# Patient Record
Sex: Female | Born: 1975 | Race: White | Hispanic: No | Marital: Single | Smoking: Never smoker
Health system: Southern US, Community
[De-identification: ages and names within clinical notes are randomized; demographics above are authoritative.]

## PROBLEM LIST (undated history)

## (undated) DIAGNOSIS — D649 Anemia, unspecified: Secondary | ICD-10-CM

## (undated) HISTORY — PX: CYST REMOVAL TRUNK: SHX6283

## (undated) HISTORY — PX: WISDOM TOOTH EXTRACTION: SHX21

---

## 2017-12-14 ENCOUNTER — Inpatient Hospital Stay (HOSPITAL_COMMUNITY)
Admission: AD | Admit: 2017-12-14 | Discharge: 2017-12-14 | Disposition: A | Discharge status: Home / Self Care | Payer: BLUE CROSS/BLUE SHIELD | Source: Ambulatory Visit | Attending: Obstetrics and Gynecology | Admitting: Obstetrics and Gynecology

## 2017-12-14 ENCOUNTER — Encounter (HOSPITAL_COMMUNITY): Payer: Self-pay | Admitting: *Deleted

## 2017-12-14 DIAGNOSIS — Z3A00 Weeks of gestation of pregnancy not specified: Secondary | ICD-10-CM | POA: Insufficient documentation

## 2017-12-14 DIAGNOSIS — O99019 Anemia complicating pregnancy, unspecified trimester: Secondary | ICD-10-CM

## 2017-12-14 MED ORDER — SODIUM CHLORIDE 0.9 % IV SOLN
975.0000 mg | Freq: Once | INTRAVENOUS | Status: AC
  Administered 2017-12-14: 975 mg via INTRAVENOUS
  Filled 2017-12-14: qty 19.5

## 2017-12-14 MED ORDER — SODIUM CHLORIDE 0.9 % IV SOLN
25.0000 mg | Freq: Once | INTRAVENOUS | Status: AC
  Administered 2017-12-14: 25 mg via INTRAVENOUS
  Filled 2017-12-14: qty 0.5

## 2017-12-14 NOTE — Progress Notes (Signed)
Iron test dose completed.

## 2017-12-14 NOTE — MAU Note (Signed)
[facility]. [facility].  EFM applied - FHR 150's and Toco applied - abd. Soft. Positive for fetal movement, denies sudden gush of fluid.

## 2017-12-14 NOTE — MAU Note (Signed)
[facility] to [facility] Iron Infusion - test dose started at 1405, BP 112/62, Pulse 82, sats 100%, Pt. With no complaints at this time.

## 2017-12-14 NOTE — MAU Note (Signed)
Waiting for iron to come from pharmacy.

## 2017-12-16 ENCOUNTER — Other Ambulatory Visit: Payer: Self-pay

## 2017-12-16 ENCOUNTER — Inpatient Hospital Stay (HOSPITAL_COMMUNITY)
Admission: AD | Admit: 2017-12-16 | Discharge: 2017-12-20 | DRG: 786 | Disposition: A | Discharge status: Home / Self Care | Payer: BLUE CROSS/BLUE SHIELD | Source: Ambulatory Visit | Attending: Obstetrics & Gynecology | Admitting: Obstetrics & Gynecology

## 2017-12-16 ENCOUNTER — Encounter (HOSPITAL_COMMUNITY): Payer: Self-pay

## 2017-12-16 DIAGNOSIS — D509 Iron deficiency anemia, unspecified: Secondary | ICD-10-CM | POA: Diagnosis present

## 2017-12-16 DIAGNOSIS — O4292 Full-term premature rupture of membranes, unspecified as to length of time between rupture and onset of labor: Principal | ICD-10-CM | POA: Diagnosis present

## 2017-12-16 DIAGNOSIS — O411230 Chorioamnionitis, third trimester, not applicable or unspecified: Secondary | ICD-10-CM | POA: Diagnosis present

## 2017-12-16 DIAGNOSIS — O324XX0 Maternal care for high head at term, not applicable or unspecified: Secondary | ICD-10-CM | POA: Diagnosis present

## 2017-12-16 DIAGNOSIS — O3663X0 Maternal care for excessive fetal growth, third trimester, not applicable or unspecified: Secondary | ICD-10-CM | POA: Diagnosis present

## 2017-12-16 DIAGNOSIS — O411231 Chorioamnionitis, third trimester, fetus 1: Secondary | ICD-10-CM | POA: Diagnosis present

## 2017-12-16 DIAGNOSIS — O9902 Anemia complicating childbirth: Secondary | ICD-10-CM | POA: Diagnosis present

## 2017-12-16 DIAGNOSIS — O99824 Streptococcus B carrier state complicating childbirth: Secondary | ICD-10-CM | POA: Diagnosis present

## 2017-12-16 DIAGNOSIS — O43123 Velamentous insertion of umbilical cord, third trimester: Secondary | ICD-10-CM | POA: Diagnosis present

## 2017-12-16 DIAGNOSIS — R0902 Hypoxemia: Secondary | ICD-10-CM

## 2017-12-16 DIAGNOSIS — Z3A39 39 weeks gestation of pregnancy: Secondary | ICD-10-CM

## 2017-12-16 DIAGNOSIS — O4290 Premature rupture of membranes, unspecified as to length of time between rupture and onset of labor, unspecified weeks of gestation: Secondary | ICD-10-CM | POA: Diagnosis present

## 2017-12-16 HISTORY — DX: Anemia, unspecified: D64.9

## 2017-12-16 LAB — OB RESULTS CONSOLE GBS: STREP GROUP B AG: POSITIVE

## 2017-12-16 LAB — ABO/RH: ABO/RH(D): O POS

## 2017-12-16 LAB — OB RESULTS CONSOLE ABO/RH: RH Type: POSITIVE

## 2017-12-16 LAB — OB RESULTS CONSOLE HEPATITIS B SURFACE ANTIGEN: Hepatitis B Surface Ag: NEGATIVE

## 2017-12-16 LAB — CBC
HCT: 30.3 % — ABNORMAL LOW (ref 36.0–46.0)
Hemoglobin: 9.4 g/dL — ABNORMAL LOW (ref 12.0–15.0)
MCH: 24.7 pg — ABNORMAL LOW (ref 26.0–34.0)
MCHC: 31 g/dL (ref 30.0–36.0)
MCV: 79.5 fL (ref 78.0–100.0)
Platelets: 190 10*3/uL (ref 150–400)
RBC: 3.81 MIL/uL — ABNORMAL LOW (ref 3.87–5.11)
RDW: 14.6 % (ref 11.5–15.5)
WBC: 20.3 10*3/uL — ABNORMAL HIGH (ref 4.0–10.5)

## 2017-12-16 LAB — TYPE AND SCREEN
ABO/RH(D): O POS
Antibody Screen: NEGATIVE

## 2017-12-16 LAB — OB RESULTS CONSOLE RUBELLA ANTIBODY, IGM: Rubella: IMMUNE

## 2017-12-16 LAB — OB RESULTS CONSOLE RPR: RPR: NONREACTIVE

## 2017-12-16 LAB — OB RESULTS CONSOLE HIV ANTIBODY (ROUTINE TESTING): HIV: NONREACTIVE

## 2017-12-16 MED ORDER — SODIUM CHLORIDE 0.9 % IV SOLN
5.0000 10*6.[IU] | Freq: Once | INTRAVENOUS | Status: DC
  Filled 2017-12-16: qty 5

## 2017-12-16 MED ORDER — OXYTOCIN BOLUS FROM INFUSION: 500.0000 mL | Freq: Once | INTRAVENOUS | Status: DC

## 2017-12-16 MED ORDER — NALBUPHINE HCL 10 MG/ML IJ SOLN
10.0000 mg | INTRAMUSCULAR | Status: AC
  Administered 2017-12-16: 10 mg via INTRAVENOUS
  Filled 2017-12-16: qty 1

## 2017-12-16 MED ORDER — LACTATED RINGERS IV SOLN
500.0000 mL | INTRAVENOUS | Status: DC | PRN
  Administered 2017-12-17 (×3): 500 mL via INTRAVENOUS
  Administered 2017-12-17 (×2): 300 mL via INTRAVENOUS

## 2017-12-16 MED ORDER — LACTATED RINGERS IV BOLUS (SEPSIS)
500.0000 mL | Freq: Once | INTRAVENOUS | Status: AC
  Administered 2017-12-16: 500 mL via INTRAVENOUS

## 2017-12-16 MED ORDER — LACTATED RINGERS IV SOLN
INTRAVENOUS | Status: DC
  Administered 2017-12-16 – 2017-12-17 (×3): via INTRAVENOUS

## 2017-12-16 MED ORDER — OXYCODONE-ACETAMINOPHEN 5-325 MG PO TABS: 1.0000 | ORAL_TABLET | ORAL | Status: DC | PRN

## 2017-12-16 MED ORDER — DEXTROSE 5 % IV SOLN
1.5000 mg/kg | Freq: Three times a day (TID) | INTRAVENOUS | Status: DC
  Administered 2017-12-16 – 2017-12-17 (×3): 110 mg via INTRAVENOUS
  Filled 2017-12-16 (×4): qty 2.75

## 2017-12-16 MED ORDER — ACETAMINOPHEN 325 MG PO TABS: 650.0000 mg | ORAL_TABLET | ORAL | Status: DC | PRN

## 2017-12-16 MED ORDER — OXYTOCIN 40 UNITS IN LACTATED RINGERS INFUSION - SIMPLE MED
2.5000 [IU]/h | INTRAVENOUS | Status: DC
  Filled 2017-12-16: qty 1000

## 2017-12-16 MED ORDER — OXYCODONE-ACETAMINOPHEN 5-325 MG PO TABS: 2.0000 | ORAL_TABLET | ORAL | Status: DC | PRN

## 2017-12-16 MED ORDER — ACETAMINOPHEN 500 MG PO TABS
1000.0000 mg | ORAL_TABLET | Freq: Four times a day (QID) | ORAL | Status: DC | PRN
  Administered 2017-12-16 – 2017-12-17 (×2): 1000 mg via ORAL
  Filled 2017-12-16 (×2): qty 2

## 2017-12-16 MED ORDER — PENICILLIN G POT IN DEXTROSE 60000 UNIT/ML IV SOLN
3.0000 10*6.[IU] | INTRAVENOUS | Status: DC
  Filled 2017-12-16: qty 50

## 2017-12-16 MED ORDER — LIDOCAINE HCL (PF) 1 % IJ SOLN
30.0000 mL | INTRAMUSCULAR | Status: DC | PRN
  Filled 2017-12-16: qty 30

## 2017-12-16 MED ORDER — SOD CITRATE-CITRIC ACID 500-334 MG/5ML PO SOLN
30.0000 mL | ORAL | Status: DC | PRN
  Administered 2017-12-17 (×3): 30 mL via ORAL
  Filled 2017-12-16 (×3): qty 15

## 2017-12-16 MED ORDER — LACTATED RINGERS IV SOLN
INTRAVENOUS | Status: DC
  Administered 2017-12-17 (×3): via INTRAVENOUS

## 2017-12-16 MED ORDER — SODIUM CHLORIDE 0.9 % IV SOLN
2.0000 g | Freq: Four times a day (QID) | INTRAVENOUS | Status: DC
  Administered 2017-12-16 – 2017-12-17 (×3): 2 g via INTRAVENOUS
  Filled 2017-12-16 (×2): qty 2
  Filled 2017-12-16: qty 2000
  Filled 2017-12-16: qty 2
  Filled 2017-12-16: qty 2000

## 2017-12-16 NOTE — MAU Note (Signed)
Has noticed increased discharge since yesterday that is at times watery and recently became red-tinged.  Last VE 4 cm 2 days ago.  Also reports a decrease in fetal movement and increased pressure.  Feeling cramps that come and go.

## 2017-12-16 NOTE — H&P (Signed)
  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 female presenting for questionable rupture f membranes - leaking fluid since last night at HS ? Time with intermittent leaking today. CTX started around 4pm.  Completed iron infusion this week for severe anemia.  Prenatal History: G1P0   EDC : 12/21/2017, by Other Basis  [facility]  [facility] / marginal cord insertion (anterior placenta) / LGA with 36 week sono 99% with EFW 8-13 / severe IDA with nadir of hgb 8.1 despite iron supplements  Prenatal Labs: ABO, Rh:  O positive Antibody:  negative Rubella:   Immune RPR:   NR HBsAg:   negative HIV:   NR GTT: NL GBS:   (+) bacturia  Medical / Surgical History :  Past medical history: anxiety  Past surgical history: IVF  Family History: No family history on file.   Social History:  has no tobacco, alcohol, and drug history on file. / works as psychiatric nurse practitioner  Allergies: Patient has no known allergies.    Current Medications at time of admission:  Prior to Admission medications   Medication Sig Start Date End Date Taking? Authorizing Provider  ferrous sulfate 325 (65 FE) MG tablet Take 325 mg by mouth daily with breakfast.    [provider]  hydrocortisone-pramoxine (ANALPRAM-HC) 2.5-1 % rectal cream APPLY RECTALLY 2 TIMES A DAY 11/15/17   [provider]  omeprazole (PRILOSEC) 20 MG capsule Take 20 mg by mouth every morning.    [provider]  Prenatal MV-Min-FA-Omega-3 (PRENATAL GUMMIES/DHA & FA PO) Take 4 capsules by mouth daily.    [provider]   Review of Systems: Active FM onset of ctx @ 4pm currently every 2 minutes LOF  / SROM sometime last night at HS (?)  time bloody show pink discharge  Physical Exam:  VS: Blood pressure 130/67, pulse (!) 115, temperature 98.1 F (36.7 C), resp. rate 19. Recheck temp with fetal tachycardia 101.1 F General: alert and oriented, appears uncomfortable  Heart: RRR Lungs: Clear lung fields Abdomen: Gravid, soft and non-tender, non-distended / uterus: gravid Extremities: no  edema  Genitalia / VE: Dilation: 3 Effacement (%): 90 Station: Plus 1 Exam by:: Sandra Ramos CNM   FHR: baseline rate 175-180 / variability decreased / accelerations absent / no decelerations TOCO: Q 2-3 moderate to strong  Assessment: [redacted] weeks gestation with prolonged ROM > 24 hours febrile with presumed chorioamnionitis active stage of labor mild fetal tachycardia  Plan:  Admit IVF start with LR bolus x 500ml Tylenol 1000mg  PO Urine culture and blood culture sent - start Amp and Gent Active management of labor  Dr Juliene PinaMody notified of admission / plan of care   Sandra Ramos CNM, MSN, [facility] 12/16/2017, 7:48 PM

## 2017-12-16 NOTE — Progress Notes (Signed)
Patient ID: Sandra Ramos, female   DOB: 27-Jul-1976, 42 y.o.   MRN: [id number] MD covering [facility] CNM for Dr [name] today.  CNM informed about admission of patient G1, 39.2 wks, GBS(+), SROM, prolonged, fever, tachycardia, fetal tachycardia and uterine irritability with no other focal symptoms, likely chorioamnionitis. Plan Amp/Gentamicin, Tylenol, IV hydration. FHT reviewed, tachycardia, minimal variability but no decels. Reassess once maternal fever down. Considering favorable cervix, low station, plan pitocin augmentation if needed and if FHT tolerate, anticipate SVD.  Will closely follow with CNM who has counseled patient on possible c-section if progress poor or FHT worsen.  V.Breccan Galant, MD

## 2017-12-16 NOTE — Progress Notes (Signed)
Discussion with patient of abnormal presentation with suspected chorioamnionitis from prolonged ROM / + GBS  Explained need for additional testing, start of antibiotic and Tylenol and IVF to reduce fever. Notified risk for CS delivery  If worsening condition remote from delivery.  Additionally, explained to patient and doula - not candidate for intermittent monitoring or water immersion.  Dr Juliene PinaMody notified of status - monitor closely and update with status change  Marlinda Mikeanya Jasiah Buntin CNM [facility]

## 2017-12-16 NOTE — Progress Notes (Addendum)
G1 @ 39.[redacted] wksga. Here dt r/o ROM since last night at 2200. Bloody show. Decreased FM. FM less today.   CNM Tanya at bs. Speculum exam and fern test done  1937: fern +  1950: Labs and IV started.   2002: Birthing notified. Report given. Room assigned to 162. Made aware of pt's GBS status and anemia. Instructed to hold pt. Will call back once ready to receive pt.    2047: provider notified of pt's 101.3 temperature and fetal tachycardia.  2052: Pt to birthing via wheelchair to room 162.

## 2017-12-16 NOTE — Progress Notes (Signed)
S:  requesting pain medication - considering epidural due to severe back and hip pain       received Tylenol  O:  VS: BP (!) 103/58   Pulse (!) 107   Temp 99.4 F (37.4 C) (Axillary)   Resp 18   Ht 5\' 5"  (1.651 m)   Wt 70.8 kg (156 lb)   BMI 25.96 kg/m         FHR : baseline 175 / variability decreased / accelerations + / no decelerations        Toco: contractions every 2-3 minutes / moderate to strong         Cervix : 5cm / 90% / vtx 0         membranes: clear with show - no odor        urine culture and blood culture collected  A: active labor     presumed chorioamnionitis from prolonged ROM     fetal tachycardia - reassuring tracing      P: discussed pain management options - desires epidural with something to help relax before epidural      nubain 10 mg IV then epidural placement    Sandra Ramos CNM, MSN, [facility] 12/16/2017, 10:24 PM

## 2017-12-17 ENCOUNTER — Encounter (HOSPITAL_COMMUNITY): Payer: Self-pay | Admitting: *Deleted

## 2017-12-17 ENCOUNTER — Inpatient Hospital Stay (HOSPITAL_COMMUNITY): Payer: BLUE CROSS/BLUE SHIELD

## 2017-12-17 ENCOUNTER — Encounter (HOSPITAL_COMMUNITY)
Admission: AD | Disposition: A | Discharge status: Home / Self Care | Payer: Self-pay | Source: Ambulatory Visit | Attending: Obstetrics & Gynecology

## 2017-12-17 ENCOUNTER — Inpatient Hospital Stay (HOSPITAL_COMMUNITY): Payer: BLUE CROSS/BLUE SHIELD | Admitting: Anesthesiology

## 2017-12-17 DIAGNOSIS — O411231 Chorioamnionitis, third trimester, fetus 1: Secondary | ICD-10-CM | POA: Diagnosis present

## 2017-12-17 LAB — CBC WITH DIFFERENTIAL/PLATELET
BASOS PCT: 0 %
Basophils Absolute: 0 10*3/uL (ref 0.0–0.1)
EOS ABS: 0 10*3/uL (ref 0.0–0.7)
EOS PCT: 0 %
HEMATOCRIT: 26.2 % — AB (ref 36.0–46.0)
HEMOGLOBIN: 8.2 g/dL — AB (ref 12.0–15.0)
LYMPHS PCT: 5 %
Lymphs Abs: 1.2 10*3/uL (ref 0.7–4.0)
MCH: 24.6 pg — AB (ref 26.0–34.0)
MCHC: 31.3 g/dL (ref 30.0–36.0)
MCV: 78.4 fL (ref 78.0–100.0)
MONOS PCT: 2 %
Monocytes Absolute: 0.5 10*3/uL (ref 0.1–1.0)
NEUTROS ABS: 22.2 10*3/uL — AB (ref 1.7–7.7)
Neutrophils Relative %: 93 %
OTHER: 0 %
Platelets: 148 10*3/uL — ABNORMAL LOW (ref 150–400)
RBC: 3.34 MIL/uL — ABNORMAL LOW (ref 3.87–5.11)
RDW: 14.6 % (ref 11.5–15.5)
WBC: 23.9 10*3/uL — ABNORMAL HIGH (ref 4.0–10.5)

## 2017-12-17 LAB — URINALYSIS, ROUTINE W REFLEX MICROSCOPIC
Bilirubin Urine: NEGATIVE
GLUCOSE, UA: NEGATIVE mg/dL
KETONES UR: 80 mg/dL — AB
NITRITE: NEGATIVE
PROTEIN: 100 mg/dL — AB
Specific Gravity, Urine: 1.012 (ref 1.005–1.030)
pH: 6 (ref 5.0–8.0)

## 2017-12-17 LAB — COMPREHENSIVE METABOLIC PANEL
ALT: 15 U/L (ref 14–54)
AST: 44 U/L — ABNORMAL HIGH (ref 15–41)
Albumin: 2 g/dL — ABNORMAL LOW (ref 3.5–5.0)
Alkaline Phosphatase: 204 U/L — ABNORMAL HIGH (ref 38–126)
Anion gap: 11 (ref 5–15)
BUN: 9 mg/dL (ref 6–20)
CHLORIDE: 101 mmol/L (ref 101–111)
CO2: 19 mmol/L — ABNORMAL LOW (ref 22–32)
Calcium: 7.8 mg/dL — ABNORMAL LOW (ref 8.9–10.3)
Creatinine, Ser: 0.66 mg/dL (ref 0.44–1.00)
Glucose, Bld: 97 mg/dL (ref 65–99)
POTASSIUM: 3.5 mmol/L (ref 3.5–5.1)
Sodium: 131 mmol/L — ABNORMAL LOW (ref 135–145)
TOTAL PROTEIN: 4.7 g/dL — AB (ref 6.5–8.1)
Total Bilirubin: 0.9 mg/dL (ref 0.3–1.2)

## 2017-12-17 LAB — LACTIC ACID, PLASMA: LACTIC ACID, VENOUS: 1.8 mmol/L (ref 0.5–1.9)

## 2017-12-17 LAB — RPR: RPR Ser Ql: NONREACTIVE

## 2017-12-17 LAB — PROCALCITONIN: PROCALCITONIN: 0.47 ng/mL

## 2017-12-17 SURGERY — Surgical Case
Anesthesia: Epidural

## 2017-12-17 SURGERY — Surgical Case
Anesthesia: Regional

## 2017-12-17 MED ORDER — NALBUPHINE HCL 10 MG/ML IJ SOLN
10.0000 mg | Freq: Once | INTRAMUSCULAR | Status: AC
  Administered 2017-12-17: 10 mg via INTRAVENOUS
  Filled 2017-12-17: qty 1

## 2017-12-17 MED ORDER — IBUPROFEN 600 MG PO TABS
600.0000 mg | ORAL_TABLET | Freq: Four times a day (QID) | ORAL | Status: DC
  Filled 2017-12-17 (×3): qty 1

## 2017-12-17 MED ORDER — MEPERIDINE HCL 25 MG/ML IJ SOLN
INTRAMUSCULAR | Status: AC
  Filled 2017-12-17: qty 1

## 2017-12-17 MED ORDER — DEXTROSE 5 % IV SOLN
1.5000 mg/kg | Freq: Three times a day (TID) | INTRAVENOUS | Status: DC
  Filled 2017-12-17 (×3): qty 2.75

## 2017-12-17 MED ORDER — PHENYLEPHRINE 40 MCG/ML (10ML) SYRINGE FOR IV PUSH (FOR BLOOD PRESSURE SUPPORT)
80.0000 ug | PREFILLED_SYRINGE | INTRAVENOUS | Status: DC | PRN
  Administered 2017-12-17 (×2): 80 ug via INTRAVENOUS
  Filled 2017-12-17: qty 10
  Filled 2017-12-17: qty 5

## 2017-12-17 MED ORDER — SCOPOLAMINE 1 MG/3DAYS TD PT72
MEDICATED_PATCH | TRANSDERMAL | Status: DC | PRN
  Administered 2017-12-17: 1 via TRANSDERMAL

## 2017-12-17 MED ORDER — PHENYLEPHRINE HCL 10 MG/ML IJ SOLN
INTRAMUSCULAR | Status: DC | PRN
  Administered 2017-12-17: 40 ug via INTRAVENOUS
  Administered 2017-12-17: 80 ug via INTRAVENOUS
  Administered 2017-12-17: 40 ug via INTRAVENOUS
  Administered 2017-12-17 (×2): 80 ug via INTRAVENOUS

## 2017-12-17 MED ORDER — LIDOCAINE-EPINEPHRINE (PF) 2 %-1:200000 IJ SOLN
INTRAMUSCULAR | Status: DC | PRN
  Administered 2017-12-17 (×3): 5 mL via EPIDURAL

## 2017-12-17 MED ORDER — SIMETHICONE 80 MG PO CHEW: 80.0000 mg | CHEWABLE_TABLET | ORAL | Status: DC | PRN

## 2017-12-17 MED ORDER — DEXAMETHASONE SODIUM PHOSPHATE 4 MG/ML IJ SOLN
INTRAMUSCULAR | Status: AC
  Filled 2017-12-17: qty 1

## 2017-12-17 MED ORDER — NALOXONE HCL 0.4 MG/ML IJ SOLN: 0.4000 mg | INTRAMUSCULAR | Status: DC | PRN

## 2017-12-17 MED ORDER — TERBUTALINE SULFATE 1 MG/ML IJ SOLN: 0.2500 mg | Freq: Once | INTRAMUSCULAR | Status: DC | PRN

## 2017-12-17 MED ORDER — GENTAMICIN SULFATE 40 MG/ML IJ SOLN
1.5000 mg/kg | Freq: Three times a day (TID) | INTRAVENOUS | Status: DC
  Filled 2017-12-17 (×4): qty 2.75

## 2017-12-17 MED ORDER — PHENYLEPHRINE 40 MCG/ML (10ML) SYRINGE FOR IV PUSH (FOR BLOOD PRESSURE SUPPORT)
80.0000 ug | PREFILLED_SYRINGE | INTRAVENOUS | Status: AC | PRN
  Administered 2017-12-17 (×3): 80 ug via INTRAVENOUS
  Filled 2017-12-17 (×2): qty 10

## 2017-12-17 MED ORDER — OXYCODONE HCL 5 MG PO TABS: 5.0000 mg | ORAL_TABLET | ORAL | Status: DC | PRN

## 2017-12-17 MED ORDER — KETOROLAC TROMETHAMINE 30 MG/ML IJ SOLN
INTRAMUSCULAR | Status: AC
  Filled 2017-12-17: qty 1

## 2017-12-17 MED ORDER — PIPERACILLIN-TAZOBACTAM 3.375 G IVPB
3.3750 g | Freq: Three times a day (TID) | INTRAVENOUS | Status: AC
  Administered 2017-12-18 – 2017-12-19 (×6): 3.375 g via INTRAVENOUS
  Filled 2017-12-17 (×6): qty 50

## 2017-12-17 MED ORDER — ACETAMINOPHEN 500 MG PO TABS
1000.0000 mg | ORAL_TABLET | Freq: Four times a day (QID) | ORAL | Status: DC | PRN
  Administered 2017-12-17 – 2017-12-20 (×8): 1000 mg via ORAL
  Filled 2017-12-17 (×9): qty 2

## 2017-12-17 MED ORDER — DEXAMETHASONE SODIUM PHOSPHATE 4 MG/ML IJ SOLN
INTRAMUSCULAR | Status: DC | PRN
  Administered 2017-12-17: 4 mg via INTRAVENOUS

## 2017-12-17 MED ORDER — OXYTOCIN 10 UNIT/ML IJ SOLN
INTRAMUSCULAR | Status: DC | PRN
  Administered 2017-12-17: 40 [IU] via INTRAVENOUS

## 2017-12-17 MED ORDER — SIMETHICONE 80 MG PO CHEW
80.0000 mg | CHEWABLE_TABLET | Freq: Three times a day (TID) | ORAL | Status: DC
  Administered 2017-12-18 – 2017-12-19 (×6): 80 mg via ORAL
  Filled 2017-12-17 (×7): qty 1

## 2017-12-17 MED ORDER — PRENATAL MULTIVITAMIN CH
1.0000 | ORAL_TABLET | Freq: Every day | ORAL | Status: DC
  Administered 2017-12-18 – 2017-12-20 (×3): 1 via ORAL
  Filled 2017-12-17 (×3): qty 1

## 2017-12-17 MED ORDER — MORPHINE SULFATE (PF) 0.5 MG/ML IJ SOLN
INTRAMUSCULAR | Status: AC
  Filled 2017-12-17: qty 10

## 2017-12-17 MED ORDER — FERROUS SULFATE 325 (65 FE) MG PO TABS
325.0000 mg | ORAL_TABLET | Freq: Every day | ORAL | Status: DC
  Filled 2017-12-17 (×2): qty 1

## 2017-12-17 MED ORDER — SODIUM CHLORIDE 0.9% FLUSH: 3.0000 mL | INTRAVENOUS | Status: DC | PRN

## 2017-12-17 MED ORDER — SCOPOLAMINE 1 MG/3DAYS TD PT72
MEDICATED_PATCH | TRANSDERMAL | Status: AC
  Filled 2017-12-17: qty 1

## 2017-12-17 MED ORDER — PHENYLEPHRINE 40 MCG/ML (10ML) SYRINGE FOR IV PUSH (FOR BLOOD PRESSURE SUPPORT)
PREFILLED_SYRINGE | INTRAVENOUS | Status: AC
  Filled 2017-12-17: qty 10

## 2017-12-17 MED ORDER — METHYLERGONOVINE MALEATE 0.2 MG/ML IJ SOLN
INTRAMUSCULAR | Status: DC | PRN
  Administered 2017-12-17: 0.2 mg via INTRAMUSCULAR

## 2017-12-17 MED ORDER — ONDANSETRON HCL 4 MG/2ML IJ SOLN
4.0000 mg | Freq: Three times a day (TID) | INTRAMUSCULAR | Status: DC | PRN
  Administered 2017-12-17: 4 mg via INTRAVENOUS
  Filled 2017-12-17: qty 2

## 2017-12-17 MED ORDER — FENTANYL 2.5 MCG/ML BUPIVACAINE 1/10 % EPIDURAL INFUSION (WH - ANES)
14.0000 mL/h | INTRAMUSCULAR | Status: DC | PRN
  Administered 2017-12-17 (×2): 14 mL/h via EPIDURAL
  Filled 2017-12-17 (×2): qty 100

## 2017-12-17 MED ORDER — MORPHINE SULFATE (PF) 0.5 MG/ML IJ SOLN
INTRAMUSCULAR | Status: DC | PRN
  Administered 2017-12-17: 1 mg via INTRAVENOUS
  Administered 2017-12-17: 4 mg via EPIDURAL

## 2017-12-17 MED ORDER — OXYTOCIN 10 UNIT/ML IJ SOLN
INTRAMUSCULAR | Status: AC
  Filled 2017-12-17: qty 4

## 2017-12-17 MED ORDER — COCONUT OIL OIL
1.0000 "application " | TOPICAL_OIL | Status: DC | PRN
  Administered 2017-12-17: 1 via TOPICAL
  Filled 2017-12-17: qty 120

## 2017-12-17 MED ORDER — OXYCODONE HCL 5 MG PO TABS: 10.0000 mg | ORAL_TABLET | ORAL | Status: DC | PRN

## 2017-12-17 MED ORDER — DEXTROSE 5 % IV SOLN: 110.0000 mg | Freq: Three times a day (TID) | INTRAVENOUS | Status: DC

## 2017-12-17 MED ORDER — DIPHENHYDRAMINE HCL 25 MG PO CAPS: 25.0000 mg | ORAL_CAPSULE | Freq: Four times a day (QID) | ORAL | Status: DC | PRN

## 2017-12-17 MED ORDER — FERROUS SULFATE 325 (65 FE) MG PO TABS
325.0000 mg | ORAL_TABLET | Freq: Two times a day (BID) | ORAL | Status: DC
  Administered 2017-12-18 – 2017-12-20 (×5): 325 mg via ORAL
  Filled 2017-12-17 (×5): qty 1

## 2017-12-17 MED ORDER — LACTATED RINGERS IV SOLN
INTRAVENOUS | Status: DC
  Administered 2017-12-17 – 2017-12-18 (×2): via INTRAVENOUS

## 2017-12-17 MED ORDER — LACTATED RINGERS IV SOLN
500.0000 mL | Freq: Once | INTRAVENOUS | Status: AC
  Administered 2017-12-17: 500 mL via INTRAVENOUS

## 2017-12-17 MED ORDER — HYDROCORTISONE ACE-PRAMOXINE 2.5-1 % RE CREA
TOPICAL_CREAM | Freq: Three times a day (TID) | RECTAL | Status: DC
  Filled 2017-12-17: qty 30

## 2017-12-17 MED ORDER — SODIUM CHLORIDE 0.9 % IV SOLN
2.0000 g | Freq: Four times a day (QID) | INTRAVENOUS | Status: DC
  Administered 2017-12-17: 2 g via INTRAVENOUS
  Filled 2017-12-17 (×4): qty 2000
  Filled 2017-12-17: qty 2
  Filled 2017-12-17: qty 2000

## 2017-12-17 MED ORDER — DIBUCAINE 1 % RE OINT: 1.0000 "application " | TOPICAL_OINTMENT | RECTAL | Status: DC | PRN

## 2017-12-17 MED ORDER — OXYTOCIN 40 UNITS IN LACTATED RINGERS INFUSION - SIMPLE MED
1.0000 m[IU]/min | INTRAVENOUS | Status: DC
  Administered 2017-12-17: 2 m[IU]/min via INTRAVENOUS

## 2017-12-17 MED ORDER — ACETAMINOPHEN 325 MG PO TABS
650.0000 mg | ORAL_TABLET | ORAL | Status: DC | PRN
  Administered 2017-12-20: 650 mg via ORAL
  Filled 2017-12-17: qty 2

## 2017-12-17 MED ORDER — SODIUM CHLORIDE 0.9 % IR SOLN
Status: DC | PRN
  Administered 2017-12-17: 1

## 2017-12-17 MED ORDER — HYDROCORTISONE 2.5 % RE CREA
TOPICAL_CREAM | Freq: Three times a day (TID) | RECTAL | Status: DC
  Filled 2017-12-17: qty 28.35

## 2017-12-17 MED ORDER — SODIUM CHLORIDE 0.9 % IV SOLN
INTRAVENOUS | Status: AC
  Administered 2017-12-17: 500 mL/h via INTRAVENOUS

## 2017-12-17 MED ORDER — SENNOSIDES-DOCUSATE SODIUM 8.6-50 MG PO TABS
2.0000 | ORAL_TABLET | ORAL | Status: DC
  Administered 2017-12-20: 2 via ORAL
  Filled 2017-12-17: qty 2

## 2017-12-17 MED ORDER — EPHEDRINE 5 MG/ML INJ: 10.0000 mg | INTRAVENOUS | Status: DC | PRN

## 2017-12-17 MED ORDER — DIPHENHYDRAMINE HCL 50 MG/ML IJ SOLN: 12.5000 mg | INTRAMUSCULAR | Status: DC | PRN

## 2017-12-17 MED ORDER — TETANUS-DIPHTH-ACELL PERTUSSIS 5-2.5-18.5 LF-MCG/0.5 IM SUSP: 0.5000 mL | Freq: Once | INTRAMUSCULAR | Status: DC

## 2017-12-17 MED ORDER — FUROSEMIDE 10 MG/ML IJ SOLN
20.0000 mg | Freq: Once | INTRAMUSCULAR | Status: DC
  Filled 2017-12-17: qty 2

## 2017-12-17 MED ORDER — PIPERACILLIN-TAZOBACTAM 3.375 G IVPB 30 MIN
3.3750 g | Freq: Once | INTRAVENOUS | Status: AC
  Administered 2017-12-17: 3.375 g via INTRAVENOUS
  Filled 2017-12-17: qty 50

## 2017-12-17 MED ORDER — ONDANSETRON HCL 4 MG/2ML IJ SOLN
INTRAMUSCULAR | Status: DC | PRN
  Administered 2017-12-17: 4 mg via INTRAVENOUS

## 2017-12-17 MED ORDER — LACTATED RINGERS IV SOLN
INTRAVENOUS | Status: DC | PRN
  Administered 2017-12-17: 13:00:00 via INTRAVENOUS

## 2017-12-17 MED ORDER — LIDOCAINE HCL (PF) 1 % IJ SOLN
INTRAMUSCULAR | Status: DC | PRN
  Administered 2017-12-17 (×2): 5 mL via EPIDURAL

## 2017-12-17 MED ORDER — PANTOPRAZOLE SODIUM 40 MG PO TBEC
40.0000 mg | DELAYED_RELEASE_TABLET | Freq: Every day | ORAL | Status: DC
Start: 2017-12-17 — End: 2017-12-17

## 2017-12-17 MED ORDER — OXYTOCIN 40 UNITS IN LACTATED RINGERS INFUSION - SIMPLE MED: 2.5000 [IU]/h | INTRAVENOUS | Status: AC

## 2017-12-17 MED ORDER — KETOROLAC TROMETHAMINE 30 MG/ML IJ SOLN
30.0000 mg | Freq: Four times a day (QID) | INTRAMUSCULAR | Status: AC | PRN
  Administered 2017-12-17: 30 mg via INTRAMUSCULAR

## 2017-12-17 MED ORDER — SIMETHICONE 80 MG PO CHEW
80.0000 mg | CHEWABLE_TABLET | ORAL | Status: DC
  Administered 2017-12-19: 80 mg via ORAL
  Filled 2017-12-17 (×2): qty 1

## 2017-12-17 MED ORDER — PHENYLEPHRINE 40 MCG/ML (10ML) SYRINGE FOR IV PUSH (FOR BLOOD PRESSURE SUPPORT)
80.0000 ug | PREFILLED_SYRINGE | INTRAVENOUS | Status: AC | PRN
  Administered 2017-12-17 (×3): 80 ug via INTRAVENOUS

## 2017-12-17 MED ORDER — MENTHOL 3 MG MT LOZG
1.0000 | LOZENGE | OROMUCOSAL | Status: DC | PRN
  Administered 2017-12-19: 3 mg via ORAL
  Filled 2017-12-17 (×2): qty 9

## 2017-12-17 MED ORDER — MEPERIDINE HCL 25 MG/ML IJ SOLN
INTRAMUSCULAR | Status: DC | PRN
  Administered 2017-12-17 (×2): 12.5 mg via INTRAVENOUS

## 2017-12-17 MED ORDER — METHYLERGONOVINE MALEATE 0.2 MG/ML IJ SOLN
INTRAMUSCULAR | Status: AC
  Filled 2017-12-17: qty 1

## 2017-12-17 MED ORDER — ONDANSETRON HCL 4 MG/2ML IJ SOLN
INTRAMUSCULAR | Status: AC
  Filled 2017-12-17: qty 2

## 2017-12-17 MED ORDER — KETOROLAC TROMETHAMINE 30 MG/ML IJ SOLN: 30.0000 mg | Freq: Four times a day (QID) | INTRAMUSCULAR | Status: AC | PRN

## 2017-12-17 MED ORDER — WITCH HAZEL-GLYCERIN EX PADS: 1.0000 "application " | MEDICATED_PAD | CUTANEOUS | Status: DC | PRN

## 2017-12-17 MED ORDER — SODIUM CHLORIDE 0.9 % IV SOLN: 2.0000 g | Freq: Four times a day (QID) | INTRAVENOUS | Status: DC

## 2017-12-17 SURGICAL SUPPLY — 35 items
BENZOIN TINCTURE PRP APPL 2/3 (GAUZE/BANDAGES/DRESSINGS) ×3 IMPLANT
CHLORAPREP W/TINT 26ML (MISCELLANEOUS) ×3 IMPLANT
CLAMP CORD UMBIL (MISCELLANEOUS) IMPLANT
CLOSURE STERI STRIP 1/2 X4 (GAUZE/BANDAGES/DRESSINGS) ×3 IMPLANT
CLOSURE WOUND 1/2 X4 (GAUZE/BANDAGES/DRESSINGS)
CLOTH BEACON ORANGE TIMEOUT ST (SAFETY) ×3 IMPLANT
DRSG OPSITE POSTOP 4X10 (GAUZE/BANDAGES/DRESSINGS) ×3 IMPLANT
ELECT REM PT RETURN 9FT ADLT (ELECTROSURGICAL) ×3
ELECTRODE REM PT RTRN 9FT ADLT (ELECTROSURGICAL) ×1 IMPLANT
EXTRACTOR VACUUM KIWI (MISCELLANEOUS) IMPLANT
EXTRACTOR VACUUM M CUP 4 TUBE (SUCTIONS) IMPLANT
EXTRACTOR VACUUM M CUP 4' TUBE (SUCTIONS)
GLOVE BIO SURGEON STRL SZ7 (GLOVE) ×3 IMPLANT
GLOVE BIOGEL PI IND STRL 7.0 (GLOVE) ×2 IMPLANT
GLOVE BIOGEL PI INDICATOR 7.0 (GLOVE) ×4
GOWN STRL REUS W/TWL LRG LVL3 (GOWN DISPOSABLE) ×6 IMPLANT
KIT ABG SYR 3ML LUER SLIP (SYRINGE) IMPLANT
NEEDLE HYPO 25X5/8 SAFETYGLIDE (NEEDLE) ×3 IMPLANT
NS IRRIG 1000ML POUR BTL (IV SOLUTION) ×3 IMPLANT
PACK C SECTION WH (CUSTOM PROCEDURE TRAY) ×3 IMPLANT
PAD OB MATERNITY 4.3X12.25 (PERSONAL CARE ITEMS) ×3 IMPLANT
RTRCTR C-SECT PINK 25CM LRG (MISCELLANEOUS) IMPLANT
STRIP CLOSURE SKIN 1/2X4 (GAUZE/BANDAGES/DRESSINGS) IMPLANT
SUT MON AB-0 CT1 36 (SUTURE) ×6 IMPLANT
SUT PLAIN 0 NONE (SUTURE) IMPLANT
SUT PLAIN 2 0 (SUTURE)
SUT PLAIN ABS 2-0 CT1 27XMFL (SUTURE) IMPLANT
SUT VIC AB 0 CT1 27 (SUTURE) ×4
SUT VIC AB 0 CT1 27XBRD ANBCTR (SUTURE) ×2 IMPLANT
SUT VIC AB 2-0 CT1 27 (SUTURE) ×2
SUT VIC AB 2-0 CT1 TAPERPNT 27 (SUTURE) ×1 IMPLANT
SUT VIC AB 4-0 KS 27 (SUTURE) ×3 IMPLANT
SUT VICRYL 0 TIES 12 18 (SUTURE) IMPLANT
TOWEL OR 17X24 6PK STRL BLUE (TOWEL DISPOSABLE) ×3 IMPLANT
TRAY FOLEY BAG SILVER LF 14FR (SET/KITS/TRAYS/PACK) IMPLANT

## 2017-12-17 NOTE — Progress Notes (Signed)
Pharmacy Antibiotic Note  Sandra Ramos is a 42 y.o. female admitted on 12/16/2017 with ROM > 24 hours with presumed chorioamnionitis.  Pharmacy has been consulted for Zosyn dosing for r/o sepsis due to low O2 sats, diaphoretic s/p LTCS.  Plan: Zosyn 3.375 gram IV q8h No further dosage adjustments necessary  Height: 5\' 5"  (165.1 cm) Weight: 156 lb (70.8 kg) IBW/kg (Calculated) : 57  Temp (24hrs), Avg:99.3 F (37.4 C), Min:98.1 F (36.7 C), Max:101.3 F (38.5 C)  Recent Labs  Lab 12/16/17 1950 12/17/17 1656  WBC 20.3* 23.9*    CrCl cannot be calculated (No order found.).    No Known Allergies  Antimicrobials this admission: Amp 2 gram IV q6h  3/14 >> 3/15 Gent 110mg  IV q8h  3/14 >> 3/15    Microbiology results: 3/14:  BCx:  3.14:  UCx:    Thank you for allowing pharmacy to be a part of this patient's care.  Claybon Jabsngel, Juni Glaab G 12/17/2017 5:26 PM

## 2017-12-17 NOTE — Progress Notes (Signed)
Dr Malen GauzeFoster, anesthesia, in patient's room to evaluate patient. Bedside report was given including labs, VS, Oxygen 4L and O2 sats & urine output are reviewed.

## 2017-12-17 NOTE — Anesthesia Preprocedure Evaluation (Signed)
Anesthesia Evaluation  Patient identified by MRN, date of birth, ID band Patient awake    Reviewed: Allergy & Precautions, NPO status , Patient's Chart, lab work & pertinent test results  History of Anesthesia Complications Negative for: history of anesthetic complications  Airway Mallampati: II  TM Distance: >3 FB Neck ROM: Full    Dental no notable dental hx. (+) Dental Advisory Given   Pulmonary neg pulmonary ROS,    Pulmonary exam normal        Cardiovascular negative cardio ROS Normal cardiovascular exam     Neuro/Psych negative neurological ROS  negative psych ROS   GI/Hepatic negative GI ROS, Neg liver ROS,   Endo/Other  negative endocrine ROS  Renal/GU negative Renal ROS  negative genitourinary   Musculoskeletal negative musculoskeletal ROS (+)   Abdominal   Peds negative pediatric ROS (+)  Hematology  (+) anemia ,   Anesthesia Other Findings   Reproductive/Obstetrics (+) Pregnancy                             Anesthesia Physical Anesthesia Plan  ASA: II  Anesthesia Plan: Epidural   Post-op Pain Management:    Induction:   PONV Risk Score and Plan:   Airway Management Planned: Natural Airway  Additional Equipment:   Intra-op Plan:   Post-operative Plan:   Informed Consent: I have reviewed the patients History and Physical, chart, labs and discussed the procedure including the risks, benefits and alternatives for the proposed anesthesia with the patient or authorized representative who has indicated his/her understanding and acceptance.   Dental advisory given  Plan Discussed with: Anesthesiologist  Anesthesia Plan Comments:         Anesthesia Quick Evaluation

## 2017-12-17 NOTE — Progress Notes (Signed)
Notified Dr Chaney MallingHodierne, anesthesia, notified of pt's unstable oxygen saturation in 70's and 80's, clear lungs, incentive spir. 2200, Pt is oriented. Slight tachicardia. Hx chorio, c/s delivery this afternoon. Order for oxygen therapy is received. Dr Chaney MallingHodierne will come to bedside to evaluate the patient.

## 2017-12-17 NOTE — Progress Notes (Signed)
Called to evaluate ~ 0700 - prolonged decel after epidural re-dose FHR 155 moderate variability + accels with 2 prolonged decels Cervix 10 cm / 100% vtx ROT +1 station  Consulted / updated Dr Juliene PinaMody with status  Will trial active second stage instad of passive descent due to FHR decels Update with status 1-2 hours  Sandra Ramos CNM [facility]

## 2017-12-17 NOTE — Progress Notes (Signed)
Dr Juliene PinaMody has been called about pt status and anesthesia evaluation. Dr Juliene PinaMody is present to evaluate Ms Sandra Ramos. Status and orders are reviewed. Dr Juliene PinaMody is aware that No Bolus order was received from anesthesia.

## 2017-12-17 NOTE — Op Note (Signed)
12/17/2017 Procedure Note Sandra Ramos  Procedure: Primary Low Transverse Cesarean Section   Indications: Arrest of descent, second stage 6 hours, pushed actively 3-4 hrs with station not progressing past +1 and OP   Pre-operative Diagnosis: Failure to descent in second stage                                               Chorioamnionitis   Post-operative Diagnosis: Same   Surgeon: Shea Evans, MD   Assistants: Marlinda Mike, CNM   Anesthesia: epidural   Procedure Details:  The patient was seen in the Holding Room. The risks, benefits, complications, treatment options, and expected outcomes were discussed with the patient. The patient concurred with the proposed plan, giving informed consent. identified as Sandra Ramos and the procedure verified as C-Section Delivery. A Time Out was held and the above information confirmed. Patient was receiving Ampicillin and Gentamicin for chorioamnionitis and will continue postpartum for 48 hours.  After induction of epidural anesthesia, the patient was draped and prepped in the usual sterile manner. Foley was reinserted and draining well.  A Pfannenstiel Incision was made and carried down through the subcutaneous tissue to the fascia. Fascial incision was made and extended transversely. The fascia was separated from the underlying rectus tissue superiorly and inferiorly. The peritoneum was identified and entered. Peritoneal incision was extended longitudinally. Lower segment was edematous and bulging. Alexis retractor placed. The utero-vesical peritoneal reflection was incised transversely and the bladder flap was bluntly freed from the lower uterine segment. A low transverse uterine incision was made. Clear amniotic fluid drained. Baby was deeply wedged in the mid pelvis and AP diameter of pelvis was very narrow so I could barely get my hand in and under the head but rotation was difficult from OP position. So while kept my fingers under the head, RN Mardella Layman  assisted per vagina with vaginal head flexed forward from OP position and now I could complete rotation and deliver the head. A long caput was noted. Baby was large and delivered with fundal pressure once head delivery was completed. Birth at 13.23 hours. Delayed cord clamping done at 1 minute and Apgar scores of 7 at one minute and 9 at five minutes. Cord ph was sent. Cord blood was obtained for evaluation. Uterine cavity was warm and lower segment boggy. Since she had chorioamnionitis and a large baby, she is risk of postpartum hemorrhage, so prophylactic methergine 0.2 mg IM was given.  The placenta was removed in pieces as it was breaking apart and all sent to pathology, Uterine cavity explored and was empty. The uterine outline, tubes and ovaries appeared normal. The uterine incision was closed with running locked sutures of followed by another imbricating layer.  Hemostasis was observed.Sandra Ramos retractor removed. Peritoneal closure done with 2-0 Vicryl. The fascia was then reapproximated with running sutures of 0Vicryl. The skin was closed with 4-0Vicryl. Steristrips and dressing placed.   Instrument, sponge, and needle counts were correct prior the abdominal closure and were correct at the conclusion of the case.   Findings: Deeply wedged head due to pushing, delivered with vaginal hand assist to lift the head.    Estimated Blood Loss: 868 mL   Total IV Fluids: 2500 cc LR  Urine Output: 145CC OF clear urine  Specimens: Cord blood, placenta, cord pH  Complications: no complications  Disposition: PACU -  hemodynamically stable.   Maternal Condition: stable   Baby condition / location:  Couplet care / Skin to Skin  Attending Attestation: I performed the procedure.   Signed: Surgeon(s): Shea EvansMody, Rosena Bartle, MD

## 2017-12-17 NOTE — Progress Notes (Signed)
S: comfortable with epidural - some pressure persists in hips and back  O:  VS: BP 100/69   Pulse (!) 117   Temp 98.6 F (37 C) (Axillary)   Resp 16   Ht 5\' 5"  (1.651 m)   Wt 70.8 kg (156 lb)   BMI 25.96 kg/m         FHR : baseline 155 / variability moderate / accelerations 10x10 / prolonged deceleration after epidural secondary to maternal hypotension - resolved        Toco: contractions every 2 minutes / 90 / strong        Cervix : 8 / 90% vtx + 1        Foley draining dark amber urine - bolus 1 liter pre-epidural  A: active labor     FHR category 2     Presumed chorioamnionitis - abx therapy initiated  P: IVF bolus x additional 300ml now      repositioned on peanut         recheck 2 hours or sooner with rectal presure   Marlinda Mikeanya Aunisty Reali CNM, MSN, [facility] 12/17/2017, 3:58 AM

## 2017-12-17 NOTE — Transfer of Care (Signed)
Immediate Anesthesia Transfer of Care Note  Patient: Sandra Ramos  Procedure(s) Performed: CESAREAN SECTION (N/A )  Patient Location: PACU  Anesthesia Type:Epidural  Level of Consciousness: awake, alert  and oriented  Airway & Oxygen Therapy: Patient Spontanous Breathing and Patient connected to nasal cannula oxygen  Post-op Assessment: Report given to RN and Post -op Vital signs reviewed and stable  Post vital signs: Reviewed and stable  Last Vitals:  Vitals:   12/17/17 1248 12/17/17 1252  BP: (!) 106/58 (!) 105/51  Pulse: (!) 118 (!) 120  Resp:    Temp:      Last Pain:  Vitals:   12/17/17 1239  TempSrc: Axillary         Complications: No apparent anesthesia complications

## 2017-12-17 NOTE — Progress Notes (Signed)
Sandra Ramos is a 42 y.o. G1P0 at 39.3 wks.  Admitted with SROM/ chorio, progressed well but complete and nut much progress in station past +1. LGA, not a vacuum candidate at this station.  Risks/complications of surgery reviewed incl infection, bleeding, damage to internal organs including bladder, bowels, ureters, blood vessels, other risks from anesthesia, VTE and delayed complications of any surgery, complications in future surgery reviewed. Also discussed neonatal complications incl difficult delivery, laceration, vacuum assistance, TTN etc. Pt understands and agrees, all concerns addressed.     Robley FriesVaishali R Anasophia Pecor 12/17/2017, 12:50 PM

## 2017-12-17 NOTE — Anesthesia Postprocedure Evaluation (Signed)
Anesthesia Post Note  Patient: Sharman CrateLaurie Pellum  Procedure(s) Performed: CESAREAN SECTION (N/A )     Patient location during evaluation: PACU Anesthesia Type: Epidural Level of consciousness: oriented and awake and alert Pain management: pain level controlled Vital Signs Assessment: post-procedure vital signs reviewed and stable Respiratory status: spontaneous breathing, respiratory function stable and patient connected to nasal cannula oxygen Cardiovascular status: blood pressure returned to baseline and stable Postop Assessment: no headache, no backache and no apparent nausea or vomiting Anesthetic complications: no    Last Vitals:  Vitals:   12/17/17 1700 12/17/17 1720  BP:    Pulse:    Resp:    Temp:    SpO2: 92% 93%    Last Pain:  Vitals:   12/17/17 1635  TempSrc: Oral   Pain Goal:                 Idrees Quam S

## 2017-12-17 NOTE — Progress Notes (Signed)
Called to evaluate patient for low O2 sats.  77% on room air.  95% on 4L [place].  HR 100, BP 124/70.  Pt is diaphoretic and pale.  Awake and alert.  She feels like she is breathing easily.   H/H yesterday was 9.4/30.3.  Today she is s/p C/S with epidural anesthetic.  History significant for chorio.  EBL for the surgery was 868 mL.  Upon exam, lungs are CTA bilaterally.  Will obtain CXR to further evaluate.  Check repeat CBC. Jamica Woodyard MD

## 2017-12-17 NOTE — Progress Notes (Signed)
Patient ID: Sandra CrateLaurie Pohle, female   DOB: Jul 01, 1976, 42 y.o.   MRN: [id number] CTSP due to low sats, diaphoresis. S: Pt denies SOB, feels she is sweaty and clammy. NO cough/ sick contact.  O:  BP 111/63   Pulse 80   Temp 98.2 F (36.8 C) (Oral)   Resp 18   Ht 5\' 5"  (1.651 m)   Wt 156 lb (70.8 kg)   SpO2 90%   Breastfeeding? Unknown   BMI 25.96 kg/m   Intake/Output Summary (Last 24 hours) at 12/17/2017 1821 Last data filed at 12/17/2017 1800 Gross per 24 hour  Intake 3380 ml  Output 2268 ml  Net 1112 ml  Urine clear now (was very concentrated since admission)   Afebrile, good peripheral pulses  A&O  Lungs CTA  CV RRR, no tachycardia now  Abdo - gaseous distention (bowel distended from long labor and on taking clear fluids 24 hrs). Uterus firm below U, tender. Incision dry, no hematoma. No intra-abdo process  Extr neg   A/P: Chorioamnionitis at admission, long labor and pushing and then C/section will increase her risk of infection spreading/ sepsis risk. She has been on Amp/Gent since admission. Change to Zosyn  CBC, CMP, LDH, CXR. IV Bolus, will use NS if low Na  Strict I/O  If above nl, continue close observation Pt counseled, report if new symptoms.

## 2017-12-17 NOTE — Progress Notes (Signed)
  S: "done" does not want to push any further - wants CS  coached pushing instructions x 1 hour without maternal effort - 2 hours of pushing with little descent - past hour caput visible at introitus with bone 1/4 cleared at symphysis  not [facility] with LGA and unproven pelvis  O:  VS: BP (!) 128/55   Pulse (!) 126   Temp (!) 100.8 F (38.2 C) (Axillary)   Resp 20   Ht 5\' 5"  (1.651 m)   Wt 70.8 kg (156 lb)   BMI 25.96 kg/m         FHR : baseline 155 / variability moderate / accelerations + (10x10)/ variable decelerations        Toco: contractions every 2-3 minutes / strong / pitocin 716mu/min discontinued         vtx easily displaced back from introitus         foley placed  A: arrest of descent with maternal exhaustion     FHR category 2     presumed chorioamnionitis  P: reviewed anticipatory guidance for CS procedure - request clear drape in OR      Dr Juliene PinaMody notified - agrees with plan / en route for CS / OR notified   Susa Loffleranya Dakotah Heiman CNM, MSN, [facility] 12/17/2017, 12:30 PM

## 2017-12-17 NOTE — Anesthesia Procedure Notes (Signed)
Epidural Patient location during procedure: OB Start time: 12/17/2017 2:19 AM End time: 12/17/2017 2:31 AM  Staffing Anesthesiologist: Heather RobertsSinger, Sophee Mckimmy, MD Performed: anesthesiologist   Preanesthetic Checklist Completed: patient identified, site marked, pre-op evaluation, timeout performed, IV checked, risks and benefits discussed and monitors and equipment checked  Epidural Patient position: sitting Prep: DuraPrep Patient monitoring: heart rate, cardiac monitor, continuous pulse ox and blood pressure Approach: midline Location: L2-L3 Injection technique: LOR saline  Needle:  Needle type: Tuohy  Needle gauge: 17 G Needle length: 9 cm Needle insertion depth: 5 cm Catheter size: 20 Guage Catheter at skin depth: 10 cm Test dose: negative and Other  Assessment Events: blood not aspirated, injection not painful, no injection resistance and negative IV test  Additional Notes Informed consent obtained prior to proceeding including risk of failure, 1% risk of PDPH, risk of minor discomfort and bruising.  Discussed rare but serious complications including epidural abscess, permanent nerve injury, epidural hematoma.  Discussed alternatives to epidural analgesia and patient desires to proceed.  Timeout performed pre-procedure verifying patient name, procedure, and platelet count.  Patient tolerated procedure well.

## 2017-12-17 NOTE — Lactation Note (Signed)
This note was copied from a baby's chart. Lactation Consultation Note  Patient Name: Sandra Ramos Sandra Ramos: 12/17/2017 Reason for consult: Initial assessment;1st time breastfeeding;Primapara;Term  319 hours old FT female who is being exclusively BF by her mother, she's a P1. Came back to the room twice, the first time baby wasn't ready to latch, she just finished feeding but parents voiced they needed some assistance with latching and requested LC to come back for next feeding.  LC did and assisted mom with feeding. Baby was able to latch on both cross cradle and football position and sustain the latch, a few swallows were heard, baby is 10 lbs and has a strong suck, mom's nipples are already getting sore with small positional stripes on each side.  Taught mom how to hand express after baby finished feeding but only obtained 2 small droplets of colostrum. Mom voiced that she didn't have a lot of breast changes during the pregnancy other than change of size and color in the areola.  Discussed with mom the possibility of starting a NS to protect her nipples and pumping but she was extremely tired at this point and falling asleep during consult; she said she'll think about it and let her RN knows.  Encouraged mom to feed baby on cues STS 8-12 times/24 hours and gave parents some tips to achieve a deep latch. Let RN know that mom needs some coconut oil, LC advised mom not to use the one she brought from home because it was contaminated with animal hair.  BF brochure, BF resources and feeding diary were reviewed. Parents are aware of LC services and will call PRN.  Maternal Data Formula Feeding for Exclusion: No Has patient been taught Hand Expression?: Yes Does the patient have breastfeeding experience prior to this delivery?: No  Feeding Feeding Type: Breast Fed Length of feed: 30 min  LATCH Score Latch: Grasps breast easily, tongue down, lips flanged, rhythmical sucking.  Audible  Swallowing: A few with stimulation  Type of Nipple: Everted at rest and after stimulation  Comfort (Breast/Nipple): Filling, red/small blisters or bruises, mild/mod discomfort  Hold (Positioning): Assistance needed to correctly position infant at breast and maintain latch.  LATCH Score: 7  Interventions Interventions: Breast feeding basics reviewed;Assisted with latch;Skin to skin;Breast massage;Hand express;Breast compression;Adjust position;Support pillows;Position options;Coconut oil  Lactation Tools Discussed/Used WIC Program: No   Consult Status Consult Status: Follow-up Ramos: 12/18/17 Follow-up type: In-patient    Crimson Beer Venetia ConstableS Elira Colasanti 12/17/2017, 10:35 PM

## 2017-12-18 DIAGNOSIS — O9902 Anemia complicating childbirth: Secondary | ICD-10-CM | POA: Diagnosis present

## 2017-12-18 LAB — CBC
HCT: 24.7 % — ABNORMAL LOW (ref 36.0–46.0)
HEMOGLOBIN: 7.8 g/dL — AB (ref 12.0–15.0)
MCH: 25.2 pg — ABNORMAL LOW (ref 26.0–34.0)
MCHC: 31.6 g/dL (ref 30.0–36.0)
MCV: 79.7 fL (ref 78.0–100.0)
Platelets: 153 10*3/uL (ref 150–400)
RBC: 3.1 MIL/uL — ABNORMAL LOW (ref 3.87–5.11)
RDW: 14.8 % (ref 11.5–15.5)
WBC: 28.2 10*3/uL — ABNORMAL HIGH (ref 4.0–10.5)

## 2017-12-18 LAB — CULTURE, OB URINE: Culture: 80000 — AB

## 2017-12-18 MED ORDER — FUROSEMIDE 10 MG/ML IJ SOLN
20.0000 mg | Freq: Once | INTRAMUSCULAR | Status: AC
  Administered 2017-12-18: 20 mg via INTRAVENOUS
  Filled 2017-12-18: qty 2

## 2017-12-18 MED ORDER — DOCUSATE SODIUM 100 MG PO CAPS
100.0000 mg | ORAL_CAPSULE | Freq: Two times a day (BID) | ORAL | Status: DC
  Administered 2017-12-18 – 2017-12-19 (×3): 100 mg via ORAL
  Filled 2017-12-18 (×4): qty 1

## 2017-12-18 NOTE — Progress Notes (Signed)
Subjective: POD# 1 Information for the patient's newborn:  Sandra Ramos, Sandra Ramos [[id number]][[id number]]  female  Baby name: [name]  Reports feeling tired but well, feels hungry. Feeding: breast Patient reports tolerating PO.  Breast symptoms: sore nipples / bruising Pain controlled withPO meds Denies HA/SOB/C/P/N/V/dizziness. Flatus absent. She reports vaginal bleeding as normal, without clots.  She is ambulating, urinating without difficulty.     Objective:   VS:    Vitals:   12/17/17 2345 12/18/17 0237 12/18/17 0602 12/18/17 0913  BP:  (!) 90/44 (!) 92/58 (!) 88/51  Pulse:  68 69 65  Resp:  18 16 20   Temp:  98 F (36.7 C) 97.8 F (36.6 C) 98.8 F (37.1 C)  TempSrc:  Oral Oral   SpO2: 100% 98% 96% 97%  Weight:      Height:          Intake/Output Summary (Last 24 hours) at 12/18/2017 0936 Last data filed at 12/18/2017 0910 Gross per 24 hour  Intake 4230 ml  Output 3018 ml  Net 1212 ml        Recent Labs    12/17/17 1656 12/18/17 0530  WBC 23.9* 28.2*  HGB 8.2* 7.8*  HCT 26.2* 24.7*  PLT 148* 153     Blood type: --/--/O POS, O POS Performed at [facility], [address] [place], [place] [address]  [phone](03/14 1950)  Rubella: Immune (03/14 0000)     Physical Exam:  General: alert, cooperative, no distress and pale CV: RRR Resp: CTAB Abdomen: + gas distention moderate, hypotonic bowel sounds Incision: clean, dry and intact Uterine Fundus: firm, below umbilicus, NT Lochia: minimal Ext: extremities normal, atraumatic, no cyanosis or edema      Assessment/Plan: 42 y.o.   POD# 1 G1P1001                  Principal Problem:   Cesarean delivery delivered 3/15 Active Problems:   Delayed delivery after SROM (spontaneous rupture of membranes)   Arrest of descent   Chorioamnionitis in third trimester, fetus 1   Postpartum care following cesarean delivery   Maternal anemia, with delivery  - patient s/p IV iron dextran 1000 mg on 3.12.19  - will continue to  improve over next several weeks, and encouraged iron rich foods  Doing well, stable.              - hypotensive yesterday evening with low O2 sats and concern for sepsis, IV ABX changed from Amp/Gent to Zosyn, patient improved overnight, now saturating normal at room air, improved UO, and stable orthostatics, even though low BP. Blood cultures pending, UCX + GBS but patient already received tx in labor.  Continue Zosyn IV until afebrile x 24 hours.   Advance diet as tolerated Encourage rest when baby rests Breastfeeding support, donor milk via S&S, lactation assist Encourage to ambulate, warm fluids for gut motility Routine post-op care  Neta Mendsaniela C Shannara Winbush, CNM, MSN 12/18/2017, 9:36 AM

## 2017-12-18 NOTE — Addendum Note (Signed)
Addendum  created 12/18/17 1051 by Shanon PayorGregory, Anik Wesch M, CRNA   Sign clinical note

## 2017-12-18 NOTE — Progress Notes (Addendum)
MOB was referred for history of depression/anxiety. * Referral screened out by Clinical Social Worker because none of the following criteria appear to apply: ~ History of anxiety/depression during this pregnancy, or of post-partum depression. ~ Diagnosis of anxiety and/or depression within last 3 years OR * MOB's symptoms currently being treated with medication and/or therapy. Please contact the Clinical Social Worker if needs arise, by [facility], or if MOB scores greater than 9/yes to question 10 on Edinburgh Postpartum Depression Screen.  MOB's chart notes active Rx for Wellbutrin.  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  It is noted that her OB is aware of her hx of Anx/Dep and plans to monitor closely during the postpartum time period.

## 2017-12-18 NOTE — Progress Notes (Signed)
Pt concerned about the fluid in her legs stating "I can't hardly walk because of all the fluid overload". Insist that I call MD to make her aware and to see if something could be done about the fluid. Pt also requesting colace to help with having a bowel movement instead of the sennok that's schedule. Notified MD on call. New orders placed.

## 2017-12-18 NOTE — Progress Notes (Signed)
I received a call from Crystal in Microbiology reporting positive GBS urine culture. Upon calling Arlan Organaniela Paul, CNM she confirmed awareness of this finding.

## 2017-12-18 NOTE — Anesthesia Postprocedure Evaluation (Signed)
Anesthesia Post Note  Patient: Sharman CrateLaurie Galvis  Procedure(s) Performed: CESAREAN SECTION (N/A )     Patient location during evaluation: Mother Baby Anesthesia Type: Epidural Level of consciousness: awake and alert and oriented Pain management: pain level controlled Vital Signs Assessment: post-procedure vital signs reviewed and stable Respiratory status: spontaneous breathing and nonlabored ventilation Cardiovascular status: stable Postop Assessment: no headache, no backache, patient able to bend at knees, no signs of nausea or vomiting and adequate PO intake Anesthetic complications: no    Last Vitals:  Vitals:   12/18/17 0602 12/18/17 0913  BP: (!) 92/58 (!) 88/51  Pulse: 69 65  Resp: 16 20  Temp: 36.6 C 37.1 C  SpO2: 96% 97%    Last Pain:  Vitals:   12/18/17 0932  TempSrc:   PainSc: 0-No pain   Pain Goal:                 Madison HickmanGREGORY,Medha Pippen

## 2017-12-19 ENCOUNTER — Inpatient Hospital Stay (HOSPITAL_COMMUNITY)
Admission: RE | Admit: 2017-12-19 | Discharge: 2017-12-19 | Disposition: A | Discharge status: Home / Self Care | Payer: BLUE CROSS/BLUE SHIELD | Source: Ambulatory Visit

## 2017-12-19 LAB — CBC
HEMATOCRIT: 22.5 % — AB (ref 36.0–46.0)
Hemoglobin: 7.3 g/dL — ABNORMAL LOW (ref 12.0–15.0)
MCH: 26.3 pg (ref 26.0–34.0)
MCHC: 32.4 g/dL (ref 30.0–36.0)
MCV: 80.9 fL (ref 78.0–100.0)
Platelets: 182 10*3/uL (ref 150–400)
RBC: 2.78 MIL/uL — ABNORMAL LOW (ref 3.87–5.11)
RDW: 15 % (ref 11.5–15.5)
WBC: 24.2 10*3/uL — AB (ref 4.0–10.5)

## 2017-12-19 MED ORDER — IBUPROFEN 200 MG PO TABS
600.0000 mg | ORAL_TABLET | Freq: Four times a day (QID) | ORAL | Status: DC
  Administered 2017-12-19 – 2017-12-20 (×5): 600 mg via ORAL
  Filled 2017-12-19 (×9): qty 3

## 2017-12-19 NOTE — Progress Notes (Addendum)
Subjective: POD# 2 Information for Sandra patient's newborn:  Theodore Ramos, Girl Sandra Ramos [[id number]][[id number]]  female  Baby name: "Sandra Ramos"  Reports feeling "tired and swollen" Feeding: breast, pumping, and syringe feeding w/ donor milk Patient reports tolerating PO.  Breast symptoms: None Pain poorly controlled with APAP Denies HA/SOB/C/P/N/V/dizziness. Flatus not passing. She reports vaginal bleeding as normal, without clots.  She is ambulating, urinating without difficulty.     Objective:   VS:    Vitals:   12/18/17 1931 12/19/17 0540  BP: (!) 95/46 (!) 94/57  Pulse: 69 62  Resp: 20 20  Temp: 97.9 F (36.6 C) 98.2 F (36.8 C)  SpO2: 100%         Intake/Output Summary (Last 24 hours) at 12/19/2017 0857 Last data filed at 12/18/2017 1315 Gross per 24 hour  Intake 1040 ml  Output 1125 ml  Net -85 ml        Recent Labs    12/18/17 0530 12/19/17 0657  WBC 28.2* 24.2*  HGB 7.8* 7.3*  HCT 24.7* 22.5*  PLT 153 182     Blood type: --/--/O POS, O POS  Rubella: Immune (03/14 0000)     Physical Exam:  General: alert, cooperative and no distress, generalized edema Abdomen: soft, non-tender, distended Incision: clean, dry, intact and Honeycomb dressing intact Uterine Fundus: firm, below umbilicus, nontender Lochia: minimal Ext: edema BLE, Negative for cords   Assessment/Plan: 42 y.o.   POD# 2. G1P1001                  Principal Problem:   Cesarean delivery delivered 3/15 Active Problems:   Delayed delivery after SROM (spontaneous rupture of membranes)   Arrest of descent   Chorioamnionitis in third trimester, fetus 1  -Blood cultures neg for growth   Postpartum care following cesarean delivery  -Advised alternating APAP and Ibuprofen for pain relief   Maternal anemia, with delivery  -S/P IV Dextran  -Continue to measure I/O until discharge   Plan  Review blood cultures  Zosyn, D/C after 48 hours  Consult with Dr. Juliene PinaMody completed  Status improving            Encourage rest when baby rests Breastfeeding support, pump, syringe feed w/ donor milk Ambulate and warm fluids for gut motility and  to pass flatus Routine post-op care Anticipate D/C 12/20/17  Roma SchanzVivian B Nalin Mazzocco, SNM

## 2017-12-19 NOTE — Lactation Note (Signed)
This note was copied from a baby's chart. Lactation Consultation Note  Patient Name: Sandra Sharman CrateLaurie Boettger ZOXWR'UToday's Date: 12/19/2017 Reason for consult: Follow-up assessment;Nipple pain/trauma;Primapara;Term Baby is 48 hours and parents are syringe feeding donor milk due to pain with latch.  Baby was also started on single phototherapy this morning.  Assisted mom with positioning baby in football hold on right.  Baby latched easily and deep but mom c/o severe pain with feeding.  Took baby off breast and nipple looked round.  Positioned baby on left breast in cross cradle.  Baby again latched easily but mom uncomfortable.  Baby fell asleep after 5 minutes and FOB to syringe feed donor milk.  Mom is pumping and hand expressing every 3 hours but no colostrum obtained.  Plan is for mom to attempt to latch every other feeding, pump every 3 hours and syringe feed donor milk per supplemental guidelines.  Maternal Data    Feeding Feeding Type: Breast Fed Length of feed: 6 min  LATCH Score Latch: Grasps breast easily, tongue down, lips flanged, rhythmical sucking.  Audible Swallowing: None  Type of Nipple: Everted at rest and after stimulation  Comfort (Breast/Nipple): Filling, red/small blisters or bruises, mild/mod discomfort  Hold (Positioning): Assistance needed to correctly position infant at breast and maintain latch.  LATCH Score: 6  Interventions    Lactation Tools Discussed/Used     Consult Status Consult Status: Follow-up Date: 12/20/17 Follow-up type: In-patient    Huston FoleyMOULDEN, Hazelene Doten S 12/19/2017, 2:22 PM

## 2017-12-20 MED ORDER — IBUPROFEN 600 MG PO TABS: 600.0000 mg | ORAL_TABLET | Freq: Four times a day (QID) | ORAL | 0 refills | Status: AC

## 2017-12-20 MED ORDER — OXYCODONE HCL 10 MG PO TABS
10.0000 mg | ORAL_TABLET | ORAL | 0 refills | Status: AC | PRN
Start: 2017-12-20 — End: ?

## 2017-12-20 MED ORDER — MAGNESIUM OXIDE 400 (241.3 MG) MG PO TABS
400.0000 mg | ORAL_TABLET | Freq: Every day | ORAL | Status: DC
  Filled 2017-12-20 (×2): qty 1

## 2017-12-20 MED ORDER — DOCUSATE SODIUM 100 MG PO CAPS: 100.0000 mg | ORAL_CAPSULE | Freq: Two times a day (BID) | ORAL | 0 refills | Status: AC

## 2017-12-20 MED ORDER — POLYSACCHARIDE IRON COMPLEX 150 MG PO CAPS: 150.0000 mg | ORAL_CAPSULE | Freq: Two times a day (BID) | ORAL | 3 refills | Status: AC

## 2017-12-20 MED ORDER — SIMETHICONE 80 MG PO CHEW: 80.0000 mg | CHEWABLE_TABLET | Freq: Three times a day (TID) | ORAL | 0 refills | Status: AC

## 2017-12-20 MED ORDER — VORTIOXETINE HBR 10 MG PO TABS: 10.0000 mg | ORAL_TABLET | Freq: Every day | ORAL | 1 refills | Status: AC

## 2017-12-20 MED ORDER — COCONUT OIL OIL: 1.0000 "application " | TOPICAL_OIL | 0 refills | Status: AC | PRN

## 2017-12-20 MED ORDER — MAGNESIUM OXIDE 400 (241.3 MG) MG PO TABS: 400.0000 mg | ORAL_TABLET | Freq: Every day | ORAL | Status: AC

## 2017-12-20 NOTE — Lactation Note (Addendum)
This note was copied from a baby's chart. Lactation Consultation Note  Patient Name: Sandra Sharman CrateLaurie Degrace EAVWU'JToday's Date: 12/20/2017 Reason for consult: Follow-up assessment;Infant weight loss;Primapara;1st time breastfeeding;Hyperbilirubinemia;Term;Nipple pain/trauma  7% weight loss , on photo tx  LC saw mom 1st at 1115 am for short visit/ had mom pumping with #24 flanges for sizing/  The #24 F were roomy but were comfortable for mom/ mom pumped for 5 mins and had  To shut the pump off due her having to go to the bathroom.  LC saw mom a 2nd visit and mom was resting in bed.  LC instructed on the use shells/ comfort gels/ and 2 - #21 flanges for mom to try.  LC stressed to mom to try the #21's and if to snug . To use the #24 flanges.  also due to the sore nipples/ positionals strips/ to use coconut oil ( alittle  dab will do on the \ Nipple and areola prior to pumping.  Due to soreness per mom has no desire to latch the baby until the nipples are healed.  And has pumped only 5 times in the last 24 hours.  LC discussed the importance of  Supply and demand and pumping every 2-3 hours to establish  And protect milk supply.  Per mom will have a DEBP at home.  Per mom the baby will be going home on single photo and has a Dr. Alfonzo BeersAppt. Tomorrow to check  Bilirubin. [facility] and LC recommended to check with office for Appt. With LC.  [facility] also [facility] and will connected with [facility].  LC highly recommends one on one with LC appt.  Mother informed of post-discharge support and given phone number to the lactation department, including services for phone call assistance; out-patient appointments; and breastfeeding support group. List of other breastfeeding resources in the community given in the handout. Encouraged mother to call for problems or concerns related to breastfeeding.  Mom concerned that baby will get nipple Confused.  LC gave  Mom and dad  options and recommended  Since the volumes are increasing , baby is jaundice,  To use two options finger feeding and Dr.Brown bottle - PACE  Feeding.  8-10 feedings a day     Maternal Data    Feeding Feeding Type: Donor Breast Milk  LATCH Score                   Interventions Interventions: Breast feeding basics reviewed  Lactation Tools Discussed/Used Tools: Shells;Pump;Flanges;Comfort gels Nipple shield size: 20;Other (comment)(LC resized and the #20 NS was a better fit ) Flange Size: 21;24;Other (comment)(LC provided #21 flanges/ see LC note ) Shell Type: Inverted Breast pump type: Double-Electric Breast Pump   Consult Status Consult Status: Follow-up Date: (per mom will be [facility] and LC recommended checking for appt. with their LC this week ) Follow-up type: Out-patient    Matilde SprangMargaret Ann Dorathea Faerber 12/20/2017, 3:30 PM

## 2017-12-20 NOTE — Discharge Summary (Signed)
OB Discharge Summary     Patient Name: Sandra Ramos DOB: [birth year] MRN: [id number]  Date of admission: 12/16/2017 Delivering MD: MODY, VAISHALI   Date of discharge: 12/20/2017  Admitting diagnosis: 39.4WKS RO RUPTURE Intrauterine pregnancy: [redacted]w[redacted]d     Secondary diagnosis:  Principal Problem:   Cesarean delivery delivered 3/15 Active Problems:   Delayed delivery after SROM (spontaneous rupture of membranes)   Arrest of descent   Chorioamnionitis in third trimester, fetus 1   Postpartum care following cesarean delivery   Maternal anemia, with delivery Hx anxiety/depression     Discharge diagnosis: Term Pregnancy Delivered and Anemia                                                                                                Post partum procedures: none  Augmentation: Pitocin  Complications: None  Hospital course:  Onset of Labor With Unplanned C/S  42 y.o. yo G1P1001 at [redacted]w[redacted]d was admitted in Active Labor on 12/16/2017. Patient had a labor course significant for prolonged rupture of membranes and maternal fever in labor. Membrane Rupture Time/Date: 10:00 PM ,12/15/2017   The patient went for cesarean section due to Arrest of Descent, and delivered a Viable infant,12/17/2017  Details of operation can be found in separate operative note.  Patient had an postpartum course complicated by postpartum hypotension and decreased oxygenation after surgery with concern for sepsis. Blood cultures x 2 and urine culture sent. Preliminary report showed no growth at 48 hours for blood, urine culture was positive for Group B strep. She received dual intravenous antibiotic regimen in labor - ampicillin and gentamycin for presumed chorioamnionitis. This was changed to IV Zosyn and continued x 48 hours postpartum. Noted improvement in patient's symptoms after first dose of Zosyn given.  Patient also with severe anemia prior to hospital admission, compounded by operative blood loss (see labs), was treated  with parenteral iron prior to hospital admitting.  Patient started on oral iron supplement prior to discharge home. She is ambulating,tolerating a regular diet, passing flatus, and urinating well.  Patient is discharged home in stable condition 12/20/17.  Physical exam  Vitals:   12/19/17 0540 12/19/17 1812 12/20/17 0035 12/20/17 0534  BP: (!) 94/57 (!) 97/55 111/62 108/65  Pulse: 62 66 64 67  Resp: 20 17 18 16   Temp: 98.2 F (36.8 C) 98.3 F (36.8 C) 98.1 F (36.7 C) 98 F (36.7 C)  TempSrc: Oral Oral Oral Oral  SpO2:  98% 96% 96%  Weight:      Height:       General: alert, cooperative and no distress Lochia: appropriate Uterine Fundus: firm Incision: Healing well with no significant drainage DVT Evaluation: No cords or calf tenderness. Calf/Ankle edema is present Labs: Lab Results  Component Value Date   WBC 24.2 (H) 12/19/2017   HGB 7.3 (L) 12/19/2017   HCT 22.5 (L) 12/19/2017   MCV 80.9 12/19/2017   PLT 182 12/19/2017   CMP Latest Ref Rng & Units 12/17/2017  Glucose 65 - 99 mg/dL 97  BUN 6 - 20 mg/dL 9  Creatinine 7.82 - 9.56 mg/dL  0.66  Sodium 135 - 145 mmol/L 131(L)  Potassium 3.5 - 5.1 mmol/L 3.5  Chloride 101 - 111 mmol/L 101  CO2 22 - 32 mmol/L 19(L)  Calcium 8.9 - 10.3 mg/dL 7.8(L)  Total Protein 6.5 - 8.1 g/dL 4.7(L)  Total Bilirubin 0.3 - 1.2 mg/dL 0.9  Alkaline Phos 38 - 126 U/L 204(H)  AST 15 - 41 U/L 44(H)  ALT 14 - 54 U/L 15    Discharge instruction: per After Visit Summary and "Baby and Me Booklet".  After visit meds:  Allergies as of 12/20/2017   No Known Allergies     Medication List    STOP taking these medications   ferrous sulfate 325 (65 FE) MG tablet     TAKE these medications   acetaminophen 325 MG tablet Commonly known as:  TYLENOL Take 650 mg by mouth every 6 (six) hours as needed for mild pain or headache.   coconut oil Oil Apply 1 application topically as needed.   docusate sodium 100 MG capsule Commonly known as:   COLACE Take 1 capsule (100 mg total) by mouth 2 (two) times daily.   hydrocortisone-pramoxine 2.5-1 % rectal cream Commonly known as:  ANALPRAM-HC APPLY RECTALLY 2 TIMES A DAY   ibuprofen 600 MG tablet Commonly known as:  ADVIL,MOTRIN Take 1 tablet (600 mg total) by mouth every 6 (six) hours.   iron polysaccharides 150 MG capsule Commonly known as:  NU-IRON Take 1 capsule (150 mg total) by mouth 2 (two) times daily.   magnesium oxide 400 (241.3 Mg) MG tablet Commonly known as:  MAG-OX Take 1 tablet (400 mg total) by mouth daily.   omeprazole 20 MG capsule Commonly known as:  PRILOSEC Take 20 mg by mouth every morning.   Oxycodone HCl 10 MG Tabs Take 1 tablet (10 mg total) by mouth every 4 (four) hours as needed (pain scale > 7).   PRENATAL GUMMIES/DHA & FA PO Take 4 capsules by mouth daily.   simethicone 80 MG chewable tablet Commonly known as:  MYLICON Chew 1 tablet (80 mg total) by mouth 3 (three) times daily after meals.   vortioxetine HBr 10 MG Tabs tablet Commonly known as:  TRINTELLIX Take 1 tablet (10 mg total) by mouth daily.       Diet: routine diet, iron rich  Activity: Advance as tolerated. Pelvic rest for 6 weeks.   Outpatient follow up:2 weeks  Postpartum contraception: IUD Paragard and Undecided  Newborn Data: Live born female [name] Birth Weight: 10 lb 4 oz (4650 g) APGAR: 7, 9  Newborn Delivery   Birth date/time:  12/17/2017 13:23:00 Delivery type:  C-Section, Low Transverse C-section categorization:  Primary     Baby Feeding: Breast Disposition:home with mother on phototherapy   12/20/2017 Neta Mendsaniela C Reshonda Koerber, CNM

## 2017-12-20 NOTE — Progress Notes (Signed)
Patient reported "heart flutters", states they started this evening. Vital signs stable, normal heart rhythm when auscultated, patient stated she didn't feel them at the time. States they lasts about 2 secs when they do  happen. Encouraged patient to rest and call RN to report any further changes. Will continue to monitor.

## 2017-12-20 NOTE — Progress Notes (Addendum)
Subjective: POD# 3 Information for the patient's newborn:  Sandra Ramos, Sandra Ramos [[id number]][[id number]]  female  Baby name: Sandra Ramos  Reports feeling better today, notes swelling improved since yesterday, feels occasional flutters in her chest, not related to activity.  Feeding: breast Patient reports tolerating PO.  Breast symptoms: pumping regularly, no colostrum noted yet, using donor milk Pain controlled withacetaminophen and ibuprofen (OTC) Denies HA/SOB/C/P/N/V/dizziness. Flatus present. She reports vaginal bleeding as normal, without clots.  She is ambulating, urinating without difficulty.     Objective:   VS:    Vitals:   12/19/17 0540 12/19/17 1812 12/20/17 0035 12/20/17 0534  BP: (!) 94/57 (!) 97/55 111/62 108/65  Pulse: 62 66 64 67  Resp: 20 17 18 16   Temp: 98.2 F (36.8 C) 98.3 F (36.8 C) 98.1 F (36.7 C) 98 F (36.7 C)  TempSrc: Oral Oral Oral Oral  SpO2:  98% 96% 96%  Weight:      Height:          Intake/Output Summary (Last 24 hours) at 12/20/2017 1012 Last data filed at 12/20/2017 0854 Gross per 24 hour  Intake 2082 ml  Output 2450 ml  Net -368 ml        Recent Labs    12/18/17 0530 12/19/17 0657  WBC 28.2* 24.2*  HGB 7.8* 7.3*  HCT 24.7* 22.5*  PLT 153 182     Blood type: --/--/O POS, O POS Performed at [facility], [address] [place], [place] [address]  [phone](03/14 1950)  Rubella: Immune (03/14 0000)     Physical Exam:  General: alert, cooperative, no distress and pale CV: Regular rate and rhythm Resp: clear Abdomen: soft, nontender, normal bowel sounds Incision: clean, dry and intact Uterine Fundus: firm, below umbilicus, nontender Lochia: minimal Ext: extremities normal, atraumatic, no cyanosis, +1 pedal and pretibial edema     Assessment/Plan: 42 y.o.   POD# 3. [id number]                  Principal Problem:   Cesarean delivery delivered 3/15 Active Problems:   Delayed delivery after SROM (spontaneous rupture of membranes)  Arrest of descent    Chorioamnionitis in third trimester, fetus 1 - Blood cx negative after 2 days growth - afebrile since delivery and completed 48 hours of IV ABX    Maternal anemia, with delivery  Discussed option for blood transfusion, may reduce edema and help recovery, will also improve energy levels. Patient declines at this time, will use diet (plans to defer vegan diet for now) and oral iron supplement to improve anemia.   Hx anxiety/depression - previously tx with Trintellix 10 mg daily, stopped after getting pregnant - at increased risk for PPD, discussed option to restart medication and patient agrees. - will resume Trintellix 10 mg daily    Postpartum care following cesarean delivery - routine PP care - DC home with instructions today - [facility] and lactation support    Neta Mendsaniela C Paul, CNM, MSN 12/20/2017, 10:12 AM

## 2017-12-22 LAB — CULTURE, BLOOD (SINGLE)
Culture: NO GROWTH
Special Requests: ADEQUATE

## 2017-12-23 ENCOUNTER — Other Ambulatory Visit: Payer: Self-pay

## 2017-12-23 ENCOUNTER — Encounter (HOSPITAL_COMMUNITY): Payer: Self-pay | Admitting: *Deleted

## 2017-12-23 ENCOUNTER — Inpatient Hospital Stay (HOSPITAL_COMMUNITY)
Admission: AD | Admit: 2017-12-23 | Discharge: 2017-12-23 | Disposition: A | Discharge status: Home / Self Care | Payer: BLUE CROSS/BLUE SHIELD | Source: Ambulatory Visit | Attending: Obstetrics and Gynecology | Admitting: Obstetrics and Gynecology

## 2017-12-23 DIAGNOSIS — O864 Pyrexia of unknown origin following delivery: Secondary | ICD-10-CM | POA: Diagnosis not present

## 2017-12-23 DIAGNOSIS — R509 Fever, unspecified: Secondary | ICD-10-CM | POA: Diagnosis present

## 2017-12-23 DIAGNOSIS — Z9889 Other specified postprocedural states: Secondary | ICD-10-CM | POA: Insufficient documentation

## 2017-12-23 LAB — CBC WITH DIFFERENTIAL/PLATELET
BASOS ABS: 0 10*3/uL (ref 0.0–0.1)
BASOS PCT: 0 %
EOS PCT: 0 %
Eosinophils Absolute: 0 10*3/uL (ref 0.0–0.7)
HCT: 27.1 % — ABNORMAL LOW (ref 36.0–46.0)
Hemoglobin: 8.5 g/dL — ABNORMAL LOW (ref 12.0–15.0)
LYMPHS PCT: 9 %
Lymphs Abs: 1.6 10*3/uL (ref 0.7–4.0)
MCH: 26.2 pg (ref 26.0–34.0)
MCHC: 31.4 g/dL (ref 30.0–36.0)
MCV: 83.4 fL (ref 78.0–100.0)
MONO ABS: 0.4 10*3/uL (ref 0.1–1.0)
MONOS PCT: 2 %
Neutro Abs: 16.3 10*3/uL (ref 1.7–7.7)
Neutrophils Relative %: 89 %
PLATELETS: 352 10*3/uL (ref 150–400)
RBC: 3.25 MIL/uL — ABNORMAL LOW (ref 3.87–5.11)
RDW: 18.7 % — AB (ref 11.5–15.5)
WBC: 18.3 10*3/uL — ABNORMAL HIGH (ref 4.0–10.5)

## 2017-12-23 LAB — URINALYSIS, ROUTINE W REFLEX MICROSCOPIC
Bacteria, UA: NONE SEEN
Bilirubin Urine: NEGATIVE
GLUCOSE, UA: NEGATIVE mg/dL
KETONES UR: NEGATIVE mg/dL
Nitrite: NEGATIVE
PH: 7 (ref 5.0–8.0)
PROTEIN: NEGATIVE mg/dL
Specific Gravity, Urine: 1.012 (ref 1.005–1.030)

## 2017-12-23 NOTE — MAU Provider Note (Signed)
History     Chief Complaint  Patient presents with  . Fever    called nursing line and was told to come in for evaluation   S/P C/S FTD, macrosomia (10'4") , POD # 6 Chorioamnionitis tx w/ 48 hrs Zosyn IV, blood cultures negative. + GBS bacteriuria at labor onset, adequate prophylaxis intrapartum.  Severe anemia prior to labor admit, tx w/ parenteral IV couple days prior to admit, started on oral Fe at discharge.   Patient DC home on POD#4 stable, reports feeling well until this am, noted Temp 101.2 at 1030 after APAP 1 gm at 0930 and NSAID 800 mg at 0700.  Diffuse pelvic discomfort and mild dysuria, feels like bladder not emptying all the way.  Bleeding is small. Edema improved overall.  Denies N/V.  Breastfeeding challenging, no milk noted until today, has been pumping and feeding supplemental donor milk.     OB History    Gravida  1   Para  1   Term  1   Preterm      AB      Living  1     SAB      TAB      Ectopic      Multiple  0   Live Births  1           Past Medical History:  Diagnosis Date  . Anemia     Past Surgical History:  Procedure Laterality Date  . CESAREAN SECTION N/A 12/17/2017   Procedure: CESAREAN SECTION;  Surgeon: Shea EvansMody, Vaishali, MD;  [facility];  Service: Obstetrics;  Laterality: N/A;  . CYST REMOVAL TRUNK    . WISDOM TOOTH EXTRACTION      No family history on file.  Social History   Tobacco Use  . Smoking status: Never Smoker  . Smokeless tobacco: Never Used  Substance Use Topics  . Alcohol use: No    Frequency: Never  . Drug use: No    Allergies: No Known Allergies  Medications Prior to Admission  Medication Sig Dispense Refill Last Dose  . acetaminophen (TYLENOL) 325 MG tablet Take 650 mg by mouth every 6 (six) hours as needed for mild pain or headache.   prn  . coconut oil OIL Apply 1 application topically as needed.  0   . docusate sodium (COLACE) 100 MG capsule Take 1 capsule (100 mg total)  by mouth 2 (two) times daily. 10 capsule 0   . hydrocortisone-pramoxine (ANALPRAM-HC) 2.5-1 % rectal cream APPLY RECTALLY 2 TIMES A DAY  2 Past Week at Unknown time  . ibuprofen (ADVIL,MOTRIN) 600 MG tablet Take 1 tablet (600 mg total) by mouth every 6 (six) hours. 30 tablet 0   . iron polysaccharides (NU-IRON) 150 MG capsule Take 1 capsule (150 mg total) by mouth 2 (two) times daily. 30 capsule 3   . magnesium oxide (MAG-OX) 400 (241.3 Mg) MG tablet Take 1 tablet (400 mg total) by mouth daily.     Marland Kitchen. omeprazole (PRILOSEC) 20 MG capsule Take 20 mg by mouth every morning.   12/17/2017 at Unknown time  . oxyCODONE 10 MG TABS Take 1 tablet (10 mg total) by mouth every 4 (four) hours as needed (pain scale > 7). 30 tablet 0   . Prenatal MV-Min-FA-Omega-3 (PRENATAL GUMMIES/DHA & FA PO) Take 4 capsules by mouth daily.   Past Week at Unknown time  . simethicone (MYLICON) 80 MG chewable tablet Chew 1 tablet (80 mg total) by mouth 3 (  three) times daily after meals. 30 tablet 0   . vortioxetine HBr (TRINTELLIX) 10 MG TABS tablet Take 1 tablet (10 mg total) by mouth daily. 30 tablet 1     Review of Systems  Constitutional: Positive for malaise/fatigue. Negative for chills and fever.  Respiratory: Negative for shortness of breath.   Cardiovascular: Positive for leg swelling.       Decreased from level of edema on discharge  Gastrointestinal: Negative for abdominal pain, nausea and vomiting.  Genitourinary: Positive for dysuria. Negative for flank pain.  All other systems reviewed and are negative.  Physical Exam     Physical Exam: Blood pressure 119/70, pulse (!) 101, temperature 98.7 F (37.1 C), temperature source Oral, resp. rate 17, unknown if currently breastfeeding. General: NAD Heart: RRR, no murmurs Breasts: no redness or masses palpated, feeling fuller, able to easily express milk Lungs: CTA b/l  Abd: Soft, NT, minimal gas distention, UF bellow U, non-tender Incision: honeycomb dressing  removed along with several old soil steristrips. Incision line clean w/o new drainage, no erythema or ecchymosis, no fluctuance palpable in SQ layer.  Lochia: scant, no odor.  Ext: +1 pedal edema     ED Course  Procedures Labs: CBC, UA, UCX pending  A/P: G1P1 POD #6, s/p C/S FTD / macrosomia / chorio Postpartum fever  - no evidence of endometritis, LTCS incision healing well and w/o signs of infection - blood cultures w/o growth on day 5 - labs pending  Suspect milk coming in finally and some febrile reaction to first milk occurring this morning, now resolved w/ antipyretics.  Patient is highly motivated to breastfeed and has been working diligently pumping and getting baby to breast in order to stimulate milk production, delayed lactogenesis d/t arduous labor course and severe anemia, but resolving.   Will DC patient home given clinical presentation is grossly normal, pending labs to be reviewed when resulted.  Will tx outpatient if ucx positive and follow closely at birth center with PP visit next week.    POC in consult w/ Dr. Alvin Critchley, CNM, MSN 12/23/2017, 12:58 PM

## 2017-12-24 LAB — URINE CULTURE

## 2019-12-10 IMAGING — DX DG CHEST 1V PORT
1 series · 1 of 1 positions shown · non-contrast
Comparison: None.

CLINICAL DATA: 41-year-old female status post C-section with
hypoxia.

EXAM:
PORTABLE CHEST 1 VIEW

[chest ap]
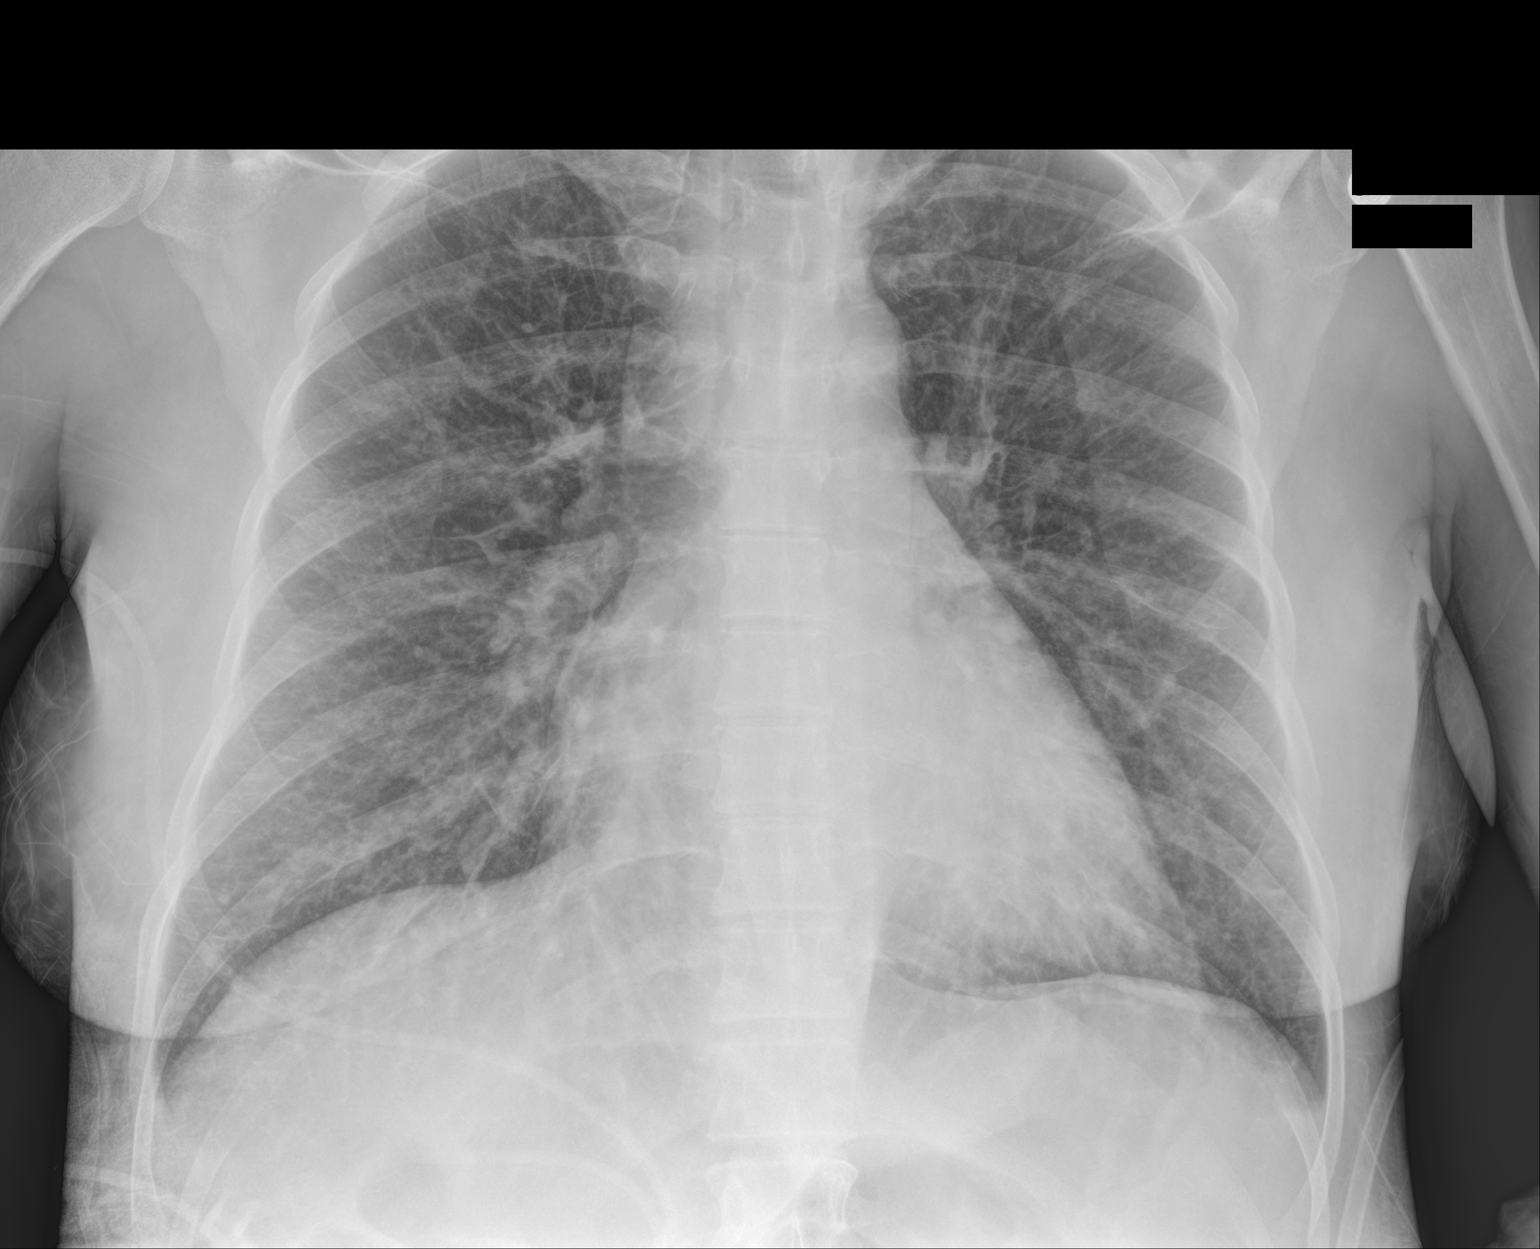

[1 of 1 positions shown; findings below may reference images not displayed]

FINDINGS: Portable AP upright view at 0900 hours. Lung volumes are within
normal limits. Normal cardiac size and mediastinal contours.
Visualized tracheal air column is within normal limits. There is
mild bilateral pulmonary vascular congestion, but no overt edema. No
pneumothorax, pleural effusion or confluent pulmonary opacity.
Negative visible bowel gas pattern. No acute osseous abnormality
identified.
IMPRESSION: Pulmonary vascular congestion without overt edema.
